# Patient Record
Sex: Female | Born: 1985 | Race: Black or African American | Hispanic: No | Marital: Single | Smoking: Current every day smoker
Health system: Southern US, Community
[De-identification: ages and names within clinical notes are randomized; demographics above are authoritative.]

## PROBLEM LIST (undated history)

## (undated) ENCOUNTER — Ambulatory Visit: Admission: EM | Discharge status: Against Medical Advice | Payer: 59

---

## 2005-02-17 ENCOUNTER — Observation Stay: Payer: Self-pay | Admitting: Obstetrics and Gynecology

## 2005-02-28 ENCOUNTER — Observation Stay: Payer: Self-pay | Admitting: Obstetrics and Gynecology

## 2005-03-02 ENCOUNTER — Observation Stay: Payer: Self-pay | Admitting: Obstetrics and Gynecology

## 2005-03-05 ENCOUNTER — Inpatient Hospital Stay: Payer: Self-pay | Admitting: Obstetrics and Gynecology

## 2005-12-29 ENCOUNTER — Emergency Department: Payer: Self-pay | Admitting: Emergency Medicine

## 2006-01-04 ENCOUNTER — Emergency Department: Payer: Self-pay | Admitting: Internal Medicine

## 2006-06-24 ENCOUNTER — Emergency Department: Payer: Self-pay | Admitting: Emergency Medicine

## 2006-06-24 ENCOUNTER — Observation Stay: Payer: Self-pay

## 2006-07-17 ENCOUNTER — Observation Stay: Payer: Self-pay | Admitting: Certified Nurse Midwife

## 2006-07-26 ENCOUNTER — Observation Stay: Payer: Self-pay

## 2006-08-05 ENCOUNTER — Observation Stay: Payer: Self-pay | Admitting: Obstetrics and Gynecology

## 2006-08-16 ENCOUNTER — Observation Stay: Payer: Self-pay

## 2006-08-22 ENCOUNTER — Inpatient Hospital Stay: Payer: Self-pay | Admitting: Obstetrics and Gynecology

## 2007-04-19 ENCOUNTER — Emergency Department: Payer: Self-pay | Admitting: Unknown Physician Specialty

## 2008-06-10 ENCOUNTER — Emergency Department: Payer: Self-pay | Admitting: Emergency Medicine

## 2008-06-22 ENCOUNTER — Emergency Department: Payer: Self-pay | Admitting: Unknown Physician Specialty

## 2008-07-24 ENCOUNTER — Emergency Department: Payer: Self-pay

## 2009-07-15 ENCOUNTER — Emergency Department: Payer: Self-pay | Admitting: Emergency Medicine

## 2009-07-24 ENCOUNTER — Emergency Department: Payer: Self-pay | Admitting: Emergency Medicine

## 2009-08-30 ENCOUNTER — Emergency Department: Payer: Self-pay | Admitting: Emergency Medicine

## 2009-10-08 ENCOUNTER — Emergency Department: Payer: Self-pay | Admitting: Emergency Medicine

## 2009-12-13 ENCOUNTER — Emergency Department: Payer: Self-pay | Admitting: Emergency Medicine

## 2010-02-03 ENCOUNTER — Emergency Department: Payer: Self-pay | Admitting: Emergency Medicine

## 2010-03-31 ENCOUNTER — Emergency Department: Payer: Self-pay | Admitting: Emergency Medicine

## 2010-05-06 ENCOUNTER — Emergency Department: Payer: Self-pay | Admitting: Emergency Medicine

## 2010-07-26 ENCOUNTER — Emergency Department: Payer: Self-pay | Admitting: Emergency Medicine

## 2010-11-26 ENCOUNTER — Emergency Department: Payer: Self-pay | Admitting: Unknown Physician Specialty

## 2010-11-30 ENCOUNTER — Emergency Department: Payer: Self-pay | Admitting: Emergency Medicine

## 2011-09-02 ENCOUNTER — Emergency Department: Payer: Self-pay | Admitting: Emergency Medicine

## 2012-01-12 ENCOUNTER — Emergency Department: Payer: Self-pay | Admitting: *Deleted

## 2012-01-12 LAB — CBC
HCT: 39.5 % (ref 35.0–47.0)
HGB: 13.1 g/dL (ref 12.0–16.0)
MCH: 29.9 pg (ref 26.0–34.0)
MCHC: 33.2 g/dL (ref 32.0–36.0)
MCV: 90 fL (ref 80–100)
Platelet: 268 10*3/uL (ref 150–440)
RBC: 4.38 10*6/uL (ref 3.80–5.20)
RDW: 12.3 % (ref 11.5–14.5)
WBC: 12.3 10*3/uL — ABNORMAL HIGH (ref 3.6–11.0)

## 2012-01-12 LAB — BASIC METABOLIC PANEL
Anion Gap: 11 (ref 7–16)
BUN: 10 mg/dL (ref 7–18)
Calcium, Total: 8.4 mg/dL — ABNORMAL LOW (ref 8.5–10.1)
Chloride: 106 mmol/L (ref 98–107)
Co2: 23 mmol/L (ref 21–32)
Creatinine: 0.88 mg/dL (ref 0.60–1.30)
EGFR (African American): >60
EGFR (Non-African Amer.): >60
Glucose: 107 mg/dL — ABNORMAL HIGH (ref 65–99)
Osmolality: 279 (ref 275–301)
Potassium: 3.9 mmol/L (ref 3.5–5.1)
Sodium: 140 mmol/L (ref 136–145)

## 2012-01-12 LAB — URINALYSIS, COMPLETE
Bilirubin,UR: NEGATIVE
Leukocyte Esterase: NEGATIVE
Nitrite: NEGATIVE
Ph: 7 (ref 4.5–8.0)
Protein: NEGATIVE
RBC,UR: <1 /HPF (ref 0–5)
Specific Gravity: 1.025 (ref 1.003–1.030)
WBC UR: <1 /HPF (ref 0–5)

## 2012-01-12 LAB — WET PREP, GENITAL

## 2012-01-12 LAB — PREGNANCY, URINE: Pregnancy Test, Urine: NEGATIVE m[IU]/mL

## 2012-05-04 ENCOUNTER — Emergency Department: Payer: Self-pay | Admitting: *Deleted

## 2012-05-04 LAB — URINALYSIS, COMPLETE
Bilirubin,UR: NEGATIVE
Glucose,UR: NEGATIVE mg/dL (ref 0–75)
Ketone: NEGATIVE
Nitrite: NEGATIVE
Specific Gravity: 1.023 (ref 1.003–1.030)
Squamous Epithelial: 4
WBC UR: 1 /HPF (ref 0–5)

## 2012-05-04 LAB — CBC
HCT: 40.2 % (ref 35.0–47.0)
MCH: 29.1 pg (ref 26.0–34.0)
MCV: 88 fL (ref 80–100)
Platelet: 279 10*3/uL (ref 150–440)
RBC: 4.56 10*6/uL (ref 3.80–5.20)
WBC: 10.3 10*3/uL (ref 3.6–11.0)

## 2012-05-04 LAB — HCG, QUANTITATIVE, PREGNANCY: Beta Hcg, Quant.: 530 m[IU]/mL — ABNORMAL HIGH

## 2012-05-09 ENCOUNTER — Emergency Department: Payer: Self-pay | Admitting: *Deleted

## 2012-05-09 LAB — URINALYSIS, COMPLETE
Bilirubin,UR: NEGATIVE
Ph: 5 (ref 4.5–8.0)
RBC,UR: 1912 /HPF (ref 0–5)
Specific Gravity: 1.035 (ref 1.003–1.030)
Squamous Epithelial: 8

## 2012-05-09 LAB — COMPREHENSIVE METABOLIC PANEL
Albumin: 3.9 g/dL (ref 3.4–5.0)
Anion Gap: 7 (ref 7–16)
BUN: 11 mg/dL (ref 7–18)
Calcium, Total: 9 mg/dL (ref 8.5–10.1)
Chloride: 108 mmol/L — ABNORMAL HIGH (ref 98–107)
Creatinine: 1.02 mg/dL (ref 0.60–1.30)
EGFR (African American): >60
Glucose: 91 mg/dL (ref 65–99)
Potassium: 3.7 mmol/L (ref 3.5–5.1)
SGOT(AST): 21 U/L (ref 15–37)
Sodium: 143 mmol/L (ref 136–145)

## 2012-05-09 LAB — CBC
HCT: 39.8 % (ref 35.0–47.0)
MCHC: 33.9 g/dL (ref 32.0–36.0)
Platelet: 289 10*3/uL (ref 150–440)
RDW: 13.2 % (ref 11.5–14.5)
WBC: 12.7 10*3/uL — ABNORMAL HIGH (ref 3.6–11.0)

## 2012-12-21 ENCOUNTER — Emergency Department: Payer: Self-pay | Admitting: Emergency Medicine

## 2012-12-21 LAB — URINALYSIS, COMPLETE
Bilirubin,UR: NEGATIVE
Protein: NEGATIVE
RBC,UR: 1 /HPF (ref 0–5)
Specific Gravity: 1.031 (ref 1.003–1.030)
Squamous Epithelial: 7

## 2012-12-21 LAB — CBC
HGB: 13.3 g/dL (ref 12.0–16.0)
MCH: 29.4 pg (ref 26.0–34.0)
MCHC: 33.3 g/dL (ref 32.0–36.0)
WBC: 12.5 10*3/uL — ABNORMAL HIGH (ref 3.6–11.0)

## 2013-09-20 ENCOUNTER — Emergency Department: Payer: Self-pay | Admitting: Emergency Medicine

## 2013-12-06 ENCOUNTER — Observation Stay: Payer: Self-pay

## 2014-01-01 ENCOUNTER — Observation Stay: Payer: Self-pay | Admitting: Obstetrics and Gynecology

## 2014-01-02 LAB — URINALYSIS, COMPLETE
Bacteria: NONE SEEN
Bilirubin,UR: NEGATIVE
Blood: NEGATIVE
Glucose,UR: NEGATIVE mg/dL (ref 0–75)
Ketone: NEGATIVE
Leukocyte Esterase: NEGATIVE
NITRITE: NEGATIVE
PH: 6 (ref 4.5–8.0)
Protein: NEGATIVE
Specific Gravity: 1.019 (ref 1.003–1.030)
Squamous Epithelial: 4
WBC UR: 1 /HPF (ref 0–5)

## 2014-04-02 ENCOUNTER — Observation Stay: Payer: Self-pay | Admitting: Obstetrics and Gynecology

## 2014-04-05 ENCOUNTER — Observation Stay: Payer: Self-pay | Admitting: Obstetrics and Gynecology

## 2014-04-09 ENCOUNTER — Observation Stay: Payer: Self-pay | Admitting: Obstetrics and Gynecology

## 2014-04-10 ENCOUNTER — Inpatient Hospital Stay: Payer: Self-pay | Admitting: Obstetrics and Gynecology

## 2014-04-10 LAB — CBC WITH DIFFERENTIAL/PLATELET
BASOS PCT: 0.3 %
Basophil #: 0 10*3/uL (ref 0.0–0.1)
Eosinophil #: 0.1 10*3/uL (ref 0.0–0.7)
Eosinophil %: 0.4 %
HCT: 38.6 % (ref 35.0–47.0)
HGB: 12.5 g/dL (ref 12.0–16.0)
LYMPHS ABS: 3.1 10*3/uL (ref 1.0–3.6)
LYMPHS PCT: 20.6 %
MCH: 28.9 pg (ref 26.0–34.0)
MCHC: 32.4 g/dL (ref 32.0–36.0)
MCV: 89 fL (ref 80–100)
Monocyte #: 1.1 x10 3/mm — ABNORMAL HIGH (ref 0.2–0.9)
Monocyte %: 7.3 %
NEUTROS PCT: 71.4 %
Neutrophil #: 10.8 10*3/uL — ABNORMAL HIGH (ref 1.4–6.5)
Platelet: 220 10*3/uL (ref 150–440)
RBC: 4.33 10*6/uL (ref 3.80–5.20)
RDW: 13.6 % (ref 11.5–14.5)
WBC: 15.1 10*3/uL — ABNORMAL HIGH (ref 3.6–11.0)

## 2014-04-11 LAB — DRUG SCREEN, URINE

## 2014-04-12 LAB — HEMATOCRIT: HCT: 34.3 % — ABNORMAL LOW (ref 35.0–47.0)

## 2014-04-13 LAB — PATHOLOGY REPORT

## 2015-02-03 NOTE — Op Note (Signed)
PATIENT NAME:  Janet Howard, Janet Howard MR#:  [id number] DATE OF BIRTH:  28-Jul-1986  DATE OF PROCEDURE:  04/11/2014  PREOPERATIVE DIAGNOSES:  1. Nonreassuring fetal monitoring.  2. Elective permanent sterilization.  3. Forty-one plus [redacted] weeks gestation.   POSTOPERATIVE DIAGNOSES:  1. Nonreassuring fetal monitoring.  2. Elective permanent sterilization.  3. Forty-one plus [redacted] weeks gestation.  4. Meconium staining. 5. Nuchal cord.   PROCEDURES:  1. Primary low transverse cesarean section.  2. Bilateral tubal ligation, [place].   ANESTHESIA: General endotracheal anesthesia, failed attempted spinal.   SURGEON:  Dr. Feliberto GottronSchermerhorn.   INDICATION: This is a 29 year old gravida 7, para 3 patient at 2241 plus 3 weeks estimated gestational age. The patient was admitted to labor and delivery the night prior to the procedure for an induction of labor. The patient labored throughout the day, started to developing moderate variable decelerations and baseline changed from 120s to 130s to low 100s and remained there despite in utero resuscitation, including oxygen, subcutaneous terbutaline, IV fluids, and position changes, cervix at 7 cm.    PROCEDURE:  The patient's abdomen was prepped and draped in normal sterile fashion. Then, performed general endotracheal anesthesia, given that they were unable to attain a spinal block. Pfannenstiel incision was made 2 fingerbreadths above the symphysis pubis. Sharp dissection was used to identify the fascia. Fascia was opened in midline and opened in transverse fashion. The superior aspect of the fascia was grasped with Kocher clamps and the recti muscles were dissected free. The inferior aspect of the fascia was grasped with Kocher clamps. Pyramidalis muscle was dissected free. Entry into the peritoneal cavity was accomplished sharply. The (Dictation Anomaly) <<utero>> peritoneal fold was identified and opened and the bladder flap was created and the bladder was reflected  inferiorly. A low transverse uterine incision was made. Upon entry into the endometrial cavity, moderate to thick meconium was noted. The incision was extended with blunt transverse traction.  Fetal head was then delivered, followed by reduction of a loose nuchal cord. Shoulders and body delivered. Female infant had a spontaneous cry on the abdomen while we were clamping the cord. Therefore, the oropharynx and nasopharynx were then suctioned prior to handing the baby to pediatric staff who assigned Apgar scores of 8 and 9. Placenta was manually delivered and the uterus was exteriorized. The endometrial cavity was wiped clean with a laparotomy tape. Uterine incision was then closed with 1 chromic suture in a running locking fashion. Two additional figure-of-eight sutures were required for good hemostasis. The posterior cul-de-sac was irrigated and suctioned and attention was then directed to the patient's right fallopian tube which was grasped at the midportion of the fallopian tube. Two separate 0 plain gut sutures were applied to the fallopian tube and a 1.5 cm portion of the fallopian tube removed. Good hemostasis noted. A similar procedure was repeated on the patient's left fallopian tube after placing two separate 0 plain gut sutures, 1.5 cm portion of fallopian tube was removed. Good hemostasis was noted. Ovaries appeared normal. The uterus was placed back into the abdominal cavity. The paracolic gutters were then wiped clean with a laparotomy tape and the tubal ligation sites appeared hemostatic. The uterine incision appeared hemostatic. Interceed was placed over the uterine incision in a T-shaped fashion. The superior aspect of the fascia was grasped with Kocher clamps and the On-Q pump catheters were placed from an infraumbilical position to a subfascial position. Then, the fascia was closed over top of these with 0 Vicryl suture in  a running, nonlocking fashion. Good approximation of edges. Subcutaneous  tissues were irrigated and bovied for hemostasis and the skin was reapproximated with Insorb absorbable sutures. Dressing was applied. The On-Q pump catheters were secured at the skin level with Dermabond and steri-stripped to the skin and Tegaderm placed over the catheters. Each catheter was loaded with 5 mL of 0.5% Marcaine. There were no complications. Estimated blood loss 600 mL, intraoperative fluids 1200 mL. Patient did receive 2 grams IV Ancef prior to commencement of the case for prophylaxis. The patient was taken to the recovery room in good condition.    ____________________________ Janet Bouchard, MD tjs:dd D: 04/11/2014 18:23:28 ET T: 04/12/2014 02:46:56 ET JOB#: [id number]  cc: Janet Bouchard, MD, <Dictator> Janet Bouchard MD ELECTRONICALLY SIGNED 04/16/2014 9:30

## 2015-02-20 NOTE — H&P (Signed)
L&D Evaluation:  History:  HPI 15XW [id number] with LMP of 06/25/13 & EDD of 6/20./15 and dated by 19 week scan with EDD of 04/03/14 here due to "soaking Maxi pads with clear fluid x 3 days". Pt seen at [facility] for [facility] in 2012, smokes tobacco, Late entry to care at 17 3/7 weeks, BMI of 43.5. No labor signs or symptoms. No VB, decresed fM or UC's.   Presents with leaking fluid   Patient's Medical History Depression, Anemia, UTI,   Patient's Surgical History D&C  LEEP  2012, [facility] D&?C x 2   Medications Pre [place]atal Vitamins   Social History tobacco  smoker 1-2 yrs-1 ppd now 1 cig a day   Family History Non-Contributory   ROS:  ROS All systems were reviewed.  HEENT, CNS, GI, GU, Respiratory, CV, Renal and Musculoskeletal systems were found to be normal.   Exam:  Vital Signs stable   General no apparent distress   Mental Status clear   Chest clear   Heart normal sinus rhythm, no murmur/gallop/rubs   Abdomen gravid, non-tender   Estimated Fetal Weight Average for gestational age   Fetal Position ? position as cannot feel presenting part   Back no CVAT   Reflexes 1+   Pelvic cervix closed and thick   Mebranes Intact, Spec exam, no fluid with cough   FHT normal rate with no decels, Age appropriate FHR   Skin dry   Lymph no lymphadenopathy   Impression:  Impression IUP at 23 3/7 weeks   Plan:  Plan Neg ferning and neg nitrazine.   Comments Speculum exam done with no fluid seen with a cough. There is clearish cx mucus that does not fern that may be what pt is having. Or she may be urinating. Pt states she had soaked 3 maxipads over the last 3 days of fluid. No perineal fluid on thighs prior to exam.   Electronic Signatures: Sharee PimpleJones, Irine Heminger W (CNM)  (Signed 24-Feb-15 15:50)  Authored: L&D Evaluation   Last Updated: 24-Feb-15 15:50 by Sharee PimpleJones, Mallary Kreger W (CNM)

## 2015-08-17 ENCOUNTER — Other Ambulatory Visit: Payer: Self-pay | Admitting: Primary Care

## 2016-01-03 ENCOUNTER — Other Ambulatory Visit: Payer: Self-pay | Admitting: Primary Care

## 2016-01-03 DIAGNOSIS — R102 Pelvic and perineal pain: Secondary | ICD-10-CM

## 2016-01-07 ENCOUNTER — Ambulatory Visit: Admission: RE | Admit: 2016-01-07 | Payer: Medicaid Other | Source: Ambulatory Visit

## 2016-01-08 ENCOUNTER — Ambulatory Visit: Payer: Medicaid Other

## 2016-01-09 ENCOUNTER — Other Ambulatory Visit: Payer: Self-pay | Admitting: Primary Care

## 2016-01-09 DIAGNOSIS — R102 Pelvic and perineal pain: Secondary | ICD-10-CM

## 2016-01-17 ENCOUNTER — Ambulatory Visit
Admission: RE | Admit: 2016-01-17 | Discharge: 2016-01-17 | Disposition: A | Discharge status: Home / Self Care | Payer: Medicaid Other | Source: Ambulatory Visit | Attending: Primary Care | Admitting: Primary Care

## 2016-01-17 DIAGNOSIS — R102 Pelvic and perineal pain: Secondary | ICD-10-CM

## 2016-01-17 DIAGNOSIS — N888 Other specified noninflammatory disorders of cervix uteri: Secondary | ICD-10-CM | POA: Insufficient documentation

## 2016-02-27 ENCOUNTER — Emergency Department
Admission: EM | Admit: 2016-02-27 | Discharge: 2016-02-27 | Disposition: A | Discharge status: Home / Self Care | Payer: Medicaid Other | Attending: Emergency Medicine | Admitting: Emergency Medicine

## 2016-02-27 ENCOUNTER — Other Ambulatory Visit: Payer: Self-pay

## 2016-02-27 DIAGNOSIS — R079 Chest pain, unspecified: Secondary | ICD-10-CM | POA: Insufficient documentation

## 2016-02-27 DIAGNOSIS — Z5321 Procedure and treatment not carried out due to patient leaving prior to being seen by health care provider: Secondary | ICD-10-CM | POA: Diagnosis not present

## 2016-09-25 ENCOUNTER — Emergency Department: Payer: Medicaid Other

## 2016-09-25 ENCOUNTER — Emergency Department
Admission: EM | Admit: 2016-09-25 | Discharge: 2016-09-25 | Disposition: A | Discharge status: Home / Self Care | Payer: Medicaid Other | Attending: Emergency Medicine | Admitting: Emergency Medicine

## 2016-09-25 DIAGNOSIS — J069 Acute upper respiratory infection, unspecified: Secondary | ICD-10-CM | POA: Diagnosis not present

## 2016-09-25 DIAGNOSIS — R05 Cough: Secondary | ICD-10-CM | POA: Diagnosis present

## 2016-09-25 DIAGNOSIS — R202 Paresthesia of skin: Secondary | ICD-10-CM | POA: Diagnosis not present

## 2016-09-25 DIAGNOSIS — F17200 Nicotine dependence, unspecified, uncomplicated: Secondary | ICD-10-CM | POA: Insufficient documentation

## 2016-09-25 LAB — BASIC METABOLIC PANEL
ANION GAP: 7 (ref 5–15)
BUN: 12 mg/dL (ref 6–20)
CALCIUM: 8.7 mg/dL — AB (ref 8.9–10.3)
CO2: 25 mmol/L (ref 22–32)
Chloride: 108 mmol/L (ref 101–111)
Creatinine, Ser: 0.84 mg/dL (ref 0.44–1.00)
GFR calc Af Amer: >60 mL/min (ref >60)
GLUCOSE: 115 mg/dL — AB (ref 65–99)
POTASSIUM: 3.6 mmol/L (ref 3.5–5.1)
SODIUM: 140 mmol/L (ref 135–145)

## 2016-09-25 LAB — CBC
HEMATOCRIT: 40.1 % (ref 35.0–47.0)
HEMOGLOBIN: 13.7 g/dL (ref 12.0–16.0)
MCH: 29.6 pg (ref 26.0–34.0)
MCHC: 34.1 g/dL (ref 32.0–36.0)
MCV: 86.7 fL (ref 80.0–100.0)
Platelets: 280 10*3/uL (ref 150–440)
RBC: 4.62 MIL/uL (ref 3.80–5.20)
RDW: 13 % (ref 11.5–14.5)
WBC: 12.5 10*3/uL — AB (ref 3.6–11.0)

## 2016-09-25 LAB — TROPONIN I

## 2016-09-25 MED ORDER — BENZONATATE 100 MG PO CAPS: 100.0000 mg | ORAL_CAPSULE | Freq: Four times a day (QID) | ORAL | 0 refills | Status: AC | PRN

## 2016-09-25 NOTE — ED Triage Notes (Signed)
Chest pain, cough, congestion, nasal congestion. Pain radiating towards left arm.

## 2016-09-25 NOTE — ED Provider Notes (Signed)
[facility] Emergency Department Provider Note  ____________________________________________  Time seen: Approximately 9:58 PM  I have reviewed the triage vital signs and the nursing notes.   HISTORY  Chief Complaint URI and Chest Pain    HPI Janet Howard is a 30 y.o. female , otherwise healthy, presenting with 2 days of cough productive of phlegm, congestion and rhinorrhea, chest discomfort with coughing, and bilateral hand tingling. The patient reports that her symptoms started yesterday but this evening during a coughing spasm she noticed tingling in her fingertips on both hands. No headache, visual changes, mental status changes. No difficulty walking, numbness or weakness.Patient had her flu shot this year.   History reviewed. No pertinent past medical history.  There are no active problems to display for this patient.   History reviewed. No pertinent surgical history.    Allergies Patient has no known allergies.  No family history on file.  Social History Social History  Substance Use Topics  . Smoking status: Current Every Day Smoker  . Smokeless tobacco: Never Used  . Alcohol use No    Review of Systems Constitutional: No fever/chills.No lightheadedness or syncope. Eyes: No visual changes. No blurred or double vision. No eye discharge.  ENT: No sore throat. Positive congestion and rhinorrhea. No ear pain. Cardiovascular: Positive chest pain. Denies palpitations. Respiratory: Denies shortness of breath.  Positive cough. Gastrointestinal: No abdominal pain.  No nausea, no vomiting.  No diarrhea.  No constipation. Genitourinary: Negative for dysuria. Musculoskeletal: Negative for back pain. Skin: Negative for rash. Neurological: Negative for headaches. No focal numbness, tingling or weakness.   10-point ROS otherwise negative.  ____________________________________________   PHYSICAL EXAM:  VITAL SIGNS: ED Triage Vitals  [09/25/16 2123]  Enc Vitals Group     BP 107/61     Pulse Rate 97     Resp 18     Temp 98.3 F (36.8 C)     Temp Source Oral     SpO2 100 %     Weight 250 lb (113.4 kg)     Height 5\' 4"  (1.626 m)     Head Circumference      Peak Flow      Pain Score 9     Pain Loc      Pain Edu?      Excl. in GC?     Constitutional: Alert and oriented. Well appearing and in no acute distress. Answers questions appropriately. Eyes: Conjunctivae are normal.  EOMI. No scleral icterus.No eye discharge. EARS: TMs are clear without bulge, erythema or fluid bilaterally. The canals are clear as well. Head: Atraumatic. Nose: Positive congestion/rhinnorhea. Mouth/Throat: Mucous membranes are moist. No posterior pharyngeal erythema, tonsillar swelling or exudate. Posterior palate is symmetric and uvula is midline Neck: No stridor.  Supple.  No meningismus. Cardiovascular: Normal rate, regular rhythm. No murmurs, rubs or gallops.  Respiratory: Normal respiratory effort.  No accessory muscle use or retractions. Lungs CTAB.  Minimal Rales in the right upper lung field without wheezing or rhonchi. Gastrointestinal: Overweight. Soft, nontender and nondistended.  No guarding or rebound.  No peritoneal signs. Musculoskeletal: No LE edema. No ttp in the calves or palpable cords.  Negative Homan's sign. Neurologic:  A&Ox3.  Speech is clear.  Face and smile are symmetric.  EOMI. PERRLA. Moves all extremities well. Gait is normal without ataxia. Skin:  Skin is warm, dry and intact. No rash noted. Psychiatric: Mood and affect are normal. Speech and behavior are normal.  Normal judgement.  ____________________________________________   LABS (all labs ordered are listed, but only abnormal results are displayed)  Labs Reviewed  CBC - Abnormal; Notable for the following:       Result Value   WBC 12.5 (*)    All other components within normal limits  BASIC METABOLIC PANEL  TROPONIN I    ____________________________________________  EKG  ED ECG REPORT I, Rockne MenghiniNorman, Anne-Caroline, the attending physician, personally viewed and interpreted this ECG.   Date: 09/25/2016  EKG Time: 2137  Rate: 89  Rhythm: normal sinus rhythm  Axis: Normal  Intervals:none  ST&T Change: No STEMI  ____________________________________________  RADIOLOGY  No results found.  ____________________________________________   PROCEDURES  Procedure(s) performed: None  Procedures  Critical Care performed: No ____________________________________________   INITIAL IMPRESSION / ASSESSMENT AND PLAN / ED COURSE  Pertinent labs & imaging results that were available during my care of the patient were reviewed by me and considered in my medical decision making (see chart for details).  30 y.o. female with cough and cold symptoms, as well as bilateral hand tingling. It is likely that the patient's tingling was due to hyperventilation in the setting of coughing spasm. I do not see any acute neurologic deficits on my examination. I did hear some rales in the left upper lung base, and we'll get a chest x-ray to evaluate for pneumonia. It is possible the patient has an influenza-like illness, but there would be no additional treatment if she tested positive so this test is deferred. At this time, the patient is hemodynamically stable and if her workup in the emergency department is reassuring, I'll plan to discharge her home with close PMD follow-up. Return precautions were discussed.  ____________________________________________  FINAL CLINICAL IMPRESSION(S) / ED DIAGNOSES  Final diagnoses:  Tingling in extremities  Upper respiratory tract infection, unspecified type    Clinical Course       NEW MEDICATIONS STARTED DURING THIS VISIT:  New Prescriptions   BENZONATATE (TESSALON PERLES) 100 MG CAPSULE    Take 1 capsule (100 mg total) by mouth every 6 (six) hours as needed for cough.       Rockne MenghiniAnne-Caroline Lucresha Dismuke, MD 09/25/16 2204

## 2016-09-25 NOTE — Discharge Instructions (Signed)
Please drink plenty of fluid to stay well-hydrated. You may take Tylenol or Motrin for pain or fever. Please take Tessalon Perles as needed for cough.  Please practice frequent and good handwashing to prevent the spread of infection.  Return to the emergency department if you develop severe pain, shortness of breath, lightheadedness or fainting, inability to keep down fluids, or any other symptoms concerning to you.

## 2016-12-29 ENCOUNTER — Encounter: Payer: Self-pay | Admitting: Intensive Care

## 2016-12-29 DIAGNOSIS — Y999 Unspecified external cause status: Secondary | ICD-10-CM | POA: Diagnosis not present

## 2016-12-29 DIAGNOSIS — Y92410 Unspecified street and highway as the place of occurrence of the external cause: Secondary | ICD-10-CM | POA: Diagnosis not present

## 2016-12-29 DIAGNOSIS — Z041 Encounter for examination and observation following transport accident: Secondary | ICD-10-CM | POA: Insufficient documentation

## 2016-12-29 DIAGNOSIS — Y939 Activity, unspecified: Secondary | ICD-10-CM | POA: Insufficient documentation

## 2016-12-29 DIAGNOSIS — F17210 Nicotine dependence, cigarettes, uncomplicated: Secondary | ICD-10-CM | POA: Diagnosis not present

## 2016-12-29 DIAGNOSIS — Z5321 Procedure and treatment not carried out due to patient leaving prior to being seen by health care provider: Secondary | ICD-10-CM | POA: Insufficient documentation
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
## 2016-12-30 ENCOUNTER — Emergency Department
Admission: EM | Admit: 2016-12-30 | Discharge: 2016-12-30 | Disposition: A | Discharge status: Home / Self Care | Payer: Medicaid Other | Attending: Emergency Medicine | Admitting: Emergency Medicine

## 2017-05-04 ENCOUNTER — Other Ambulatory Visit: Payer: Self-pay | Admitting: Primary Care

## 2017-05-04 DIAGNOSIS — N938 Other specified abnormal uterine and vaginal bleeding: Secondary | ICD-10-CM

## 2017-05-07 ENCOUNTER — Ambulatory Visit: Payer: Medicaid Other

## 2017-11-13 DIAGNOSIS — H538 Other visual disturbances: Secondary | ICD-10-CM | POA: Diagnosis not present

## 2018-01-21 DIAGNOSIS — N760 Acute vaginitis: Secondary | ICD-10-CM | POA: Diagnosis not present

## 2018-02-15 ENCOUNTER — Encounter (INDEPENDENT_AMBULATORY_CARE_PROVIDER_SITE_OTHER): Payer: Medicaid Other | Admitting: Vascular Surgery

## 2018-02-16 ENCOUNTER — Encounter (INDEPENDENT_AMBULATORY_CARE_PROVIDER_SITE_OTHER): Payer: Medicaid Other | Admitting: Vascular Surgery

## 2018-07-09 DIAGNOSIS — Z1389 Encounter for screening for other disorder: Secondary | ICD-10-CM | POA: Diagnosis not present

## 2018-07-09 DIAGNOSIS — Z01419 Encounter for gynecological examination (general) (routine) without abnormal findings: Secondary | ICD-10-CM | POA: Diagnosis not present

## 2018-07-09 DIAGNOSIS — N760 Acute vaginitis: Secondary | ICD-10-CM | POA: Diagnosis not present

## 2018-07-09 DIAGNOSIS — Z23 Encounter for immunization: Secondary | ICD-10-CM | POA: Diagnosis not present

## 2018-10-06 ENCOUNTER — Emergency Department: Payer: 59

## 2018-10-06 ENCOUNTER — Other Ambulatory Visit: Payer: Self-pay

## 2018-10-06 ENCOUNTER — Emergency Department
Admission: EM | Admit: 2018-10-06 | Discharge: 2018-10-06 | Disposition: A | Discharge status: Home / Self Care | Payer: 59 | Attending: Emergency Medicine | Admitting: Emergency Medicine

## 2018-10-06 ENCOUNTER — Encounter: Payer: Self-pay | Admitting: Emergency Medicine

## 2018-10-06 DIAGNOSIS — R05 Cough: Secondary | ICD-10-CM | POA: Diagnosis not present

## 2018-10-06 DIAGNOSIS — R079 Chest pain, unspecified: Secondary | ICD-10-CM | POA: Diagnosis not present

## 2018-10-06 DIAGNOSIS — Z5321 Procedure and treatment not carried out due to patient leaving prior to being seen by health care provider: Secondary | ICD-10-CM | POA: Insufficient documentation

## 2018-10-06 LAB — COMPREHENSIVE METABOLIC PANEL
ALBUMIN: 4.2 g/dL (ref 3.5–5.0)
ALK PHOS: 97 U/L (ref 38–126)
ALT: 15 U/L (ref 0–44)
AST: 16 U/L (ref 15–41)
Anion gap: 8 (ref 5–15)
BILIRUBIN TOTAL: 0.2 mg/dL — AB (ref 0.3–1.2)
BUN: 13 mg/dL (ref 6–20)
CALCIUM: 8.9 mg/dL (ref 8.9–10.3)
CO2: 24 mmol/L (ref 22–32)
CREATININE: 0.7 mg/dL (ref 0.44–1.00)
Chloride: 106 mmol/L (ref 98–111)
GFR calc Af Amer: >60 mL/min (ref >60)
GFR calc non Af Amer: >60 mL/min (ref >60)
GLUCOSE: 88 mg/dL (ref 70–99)
Potassium: 3.8 mmol/L (ref 3.5–5.1)
Sodium: 138 mmol/L (ref 135–145)
TOTAL PROTEIN: 6.7 g/dL (ref 6.5–8.1)

## 2018-10-06 LAB — CBC
HCT: 39.7 % (ref 36.0–46.0)
HEMOGLOBIN: 13 g/dL (ref 12.0–15.0)
MCH: 28.4 pg (ref 26.0–34.0)
MCHC: 32.7 g/dL (ref 30.0–36.0)
MCV: 86.9 fL (ref 80.0–100.0)
NRBC: 0 % (ref 0.0–0.2)
Platelets: 308 10*3/uL (ref 150–400)
RBC: 4.57 MIL/uL (ref 3.87–5.11)
RDW: 12.8 % (ref 11.5–15.5)
WBC: 12.4 10*3/uL — ABNORMAL HIGH (ref 4.0–10.5)

## 2018-10-06 LAB — TROPONIN I: Troponin I: <0.03 ng/mL (ref <0.03)

## 2018-10-06 NOTE — ED Triage Notes (Signed)
Chest pain since this am. Cough since yesterday. Pain increases with inspiration.

## 2018-10-29 DIAGNOSIS — Z1389 Encounter for screening for other disorder: Secondary | ICD-10-CM | POA: Diagnosis not present

## 2018-10-29 DIAGNOSIS — Z789 Other specified health status: Secondary | ICD-10-CM | POA: Diagnosis not present

## 2018-10-29 DIAGNOSIS — B373 Candidiasis of vulva and vagina: Secondary | ICD-10-CM | POA: Diagnosis not present

## 2018-10-29 DIAGNOSIS — Z1329 Encounter for screening for other suspected endocrine disorder: Secondary | ICD-10-CM | POA: Diagnosis not present

## 2018-10-29 DIAGNOSIS — R358 Other polyuria: Secondary | ICD-10-CM | POA: Diagnosis not present

## 2018-10-29 DIAGNOSIS — Z3200 Encounter for pregnancy test, result unknown: Secondary | ICD-10-CM | POA: Diagnosis not present

## 2019-10-17 ENCOUNTER — Other Ambulatory Visit: Payer: Self-pay

## 2019-10-25 ENCOUNTER — Other Ambulatory Visit: Payer: Medicaid Other

## 2020-11-21 ENCOUNTER — Emergency Department (HOSPITAL_COMMUNITY)
Admission: EM | Admit: 2020-11-21 | Discharge: 2020-11-21 | Disposition: A | Discharge status: Home / Self Care | Payer: Medicaid Other | Attending: Emergency Medicine | Admitting: Emergency Medicine

## 2020-11-21 ENCOUNTER — Emergency Department (HOSPITAL_COMMUNITY): Payer: Medicaid Other

## 2020-11-21 ENCOUNTER — Encounter (HOSPITAL_COMMUNITY): Payer: Self-pay

## 2020-11-21 DIAGNOSIS — R079 Chest pain, unspecified: Secondary | ICD-10-CM | POA: Diagnosis not present

## 2020-11-21 DIAGNOSIS — Z5321 Procedure and treatment not carried out due to patient leaving prior to being seen by health care provider: Secondary | ICD-10-CM | POA: Diagnosis not present

## 2020-11-21 DIAGNOSIS — R109 Unspecified abdominal pain: Secondary | ICD-10-CM | POA: Diagnosis not present

## 2020-11-21 DIAGNOSIS — M542 Cervicalgia: Secondary | ICD-10-CM | POA: Diagnosis present

## 2020-11-21 DIAGNOSIS — Y92410 Unspecified street and highway as the place of occurrence of the external cause: Secondary | ICD-10-CM | POA: Diagnosis not present

## 2020-11-21 NOTE — ED Notes (Signed)
Left on own accord 
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
## 2020-11-22 ENCOUNTER — Emergency Department (HOSPITAL_COMMUNITY)
Admission: EM | Admit: 2020-11-22 | Discharge: 2020-11-22 | Disposition: A | Discharge status: Home / Self Care | Payer: No Typology Code available for payment source | Attending: Emergency Medicine | Admitting: Emergency Medicine

## 2020-11-22 ENCOUNTER — Encounter (HOSPITAL_COMMUNITY): Payer: Self-pay

## 2020-11-22 ENCOUNTER — Emergency Department (HOSPITAL_COMMUNITY): Payer: No Typology Code available for payment source

## 2020-11-22 ENCOUNTER — Other Ambulatory Visit: Payer: Self-pay

## 2020-11-22 DIAGNOSIS — Y92410 Unspecified street and highway as the place of occurrence of the external cause: Secondary | ICD-10-CM | POA: Insufficient documentation

## 2020-11-22 DIAGNOSIS — S79911A Unspecified injury of right hip, initial encounter: Secondary | ICD-10-CM | POA: Diagnosis present

## 2020-11-22 DIAGNOSIS — F17210 Nicotine dependence, cigarettes, uncomplicated: Secondary | ICD-10-CM | POA: Diagnosis not present

## 2020-11-22 DIAGNOSIS — M62830 Muscle spasm of back: Secondary | ICD-10-CM | POA: Diagnosis not present

## 2020-11-22 DIAGNOSIS — M25561 Pain in right knee: Secondary | ICD-10-CM | POA: Insufficient documentation

## 2020-11-22 DIAGNOSIS — M7918 Myalgia, other site: Secondary | ICD-10-CM

## 2020-11-22 DIAGNOSIS — S7012XA Contusion of left thigh, initial encounter: Secondary | ICD-10-CM | POA: Diagnosis not present

## 2020-11-22 DIAGNOSIS — M542 Cervicalgia: Secondary | ICD-10-CM | POA: Diagnosis not present

## 2020-11-22 LAB — URINALYSIS, ROUTINE W REFLEX MICROSCOPIC
Bilirubin Urine: NEGATIVE
Glucose, UA: NEGATIVE mg/dL
Hgb urine dipstick: NEGATIVE
Ketones, ur: 5 mg/dL — AB
Leukocytes,Ua: NEGATIVE
Nitrite: NEGATIVE
Protein, ur: NEGATIVE mg/dL
Specific Gravity, Urine: 1.033 — ABNORMAL HIGH (ref 1.005–1.030)
pH: 5 (ref 5.0–8.0)

## 2020-11-22 LAB — CBC WITH DIFFERENTIAL/PLATELET
Abs Immature Granulocytes: 0.03 10*3/uL (ref 0.00–0.07)
Basophils Absolute: 0 10*3/uL (ref 0.0–0.1)
Basophils Relative: 0 %
Eosinophils Absolute: 0.2 10*3/uL (ref 0.0–0.5)
Eosinophils Relative: 1 %
HCT: 41.2 % (ref 36.0–46.0)
Hemoglobin: 13.6 g/dL (ref 12.0–15.0)
Immature Granulocytes: 0 %
Lymphocytes Relative: 25 %
Lymphs Abs: 2.8 10*3/uL (ref 0.7–4.0)
MCH: 29.5 pg (ref 26.0–34.0)
MCHC: 33 g/dL (ref 30.0–36.0)
MCV: 89.4 fL (ref 80.0–100.0)
Monocytes Absolute: 0.7 10*3/uL (ref 0.1–1.0)
Monocytes Relative: 7 %
Neutro Abs: 7.4 10*3/uL (ref 1.7–7.7)
Neutrophils Relative %: 67 %
Platelets: 283 10*3/uL (ref 150–400)
RBC: 4.61 MIL/uL (ref 3.87–5.11)
RDW: 12.9 % (ref 11.5–15.5)
WBC: 11.1 10*3/uL — ABNORMAL HIGH (ref 4.0–10.5)
nRBC: 0 % (ref 0.0–0.2)

## 2020-11-22 LAB — COMPREHENSIVE METABOLIC PANEL
ALT: 22 U/L (ref 0–44)
AST: 22 U/L (ref 15–41)
Albumin: 4 g/dL (ref 3.5–5.0)
Alkaline Phosphatase: 92 U/L (ref 38–126)
Anion gap: 12 (ref 5–15)
BUN: 13 mg/dL (ref 6–20)
CO2: 23 mmol/L (ref 22–32)
Calcium: 9.1 mg/dL (ref 8.9–10.3)
Chloride: 103 mmol/L (ref 98–111)
Creatinine, Ser: 0.73 mg/dL (ref 0.44–1.00)
GFR, Estimated: >60 mL/min (ref >60)
Glucose, Bld: 140 mg/dL — ABNORMAL HIGH (ref 70–99)
Potassium: 3.8 mmol/L (ref 3.5–5.1)
Sodium: 138 mmol/L (ref 135–145)
Total Bilirubin: 0.8 mg/dL (ref 0.3–1.2)
Total Protein: 7 g/dL (ref 6.5–8.1)

## 2020-11-22 MED ORDER — ACETAMINOPHEN 325 MG PO TABS
650.0000 mg | ORAL_TABLET | Freq: Once | ORAL | Status: AC
  Administered 2020-11-22: 650 mg via ORAL
  Filled 2020-11-22: qty 2

## 2020-11-22 MED ORDER — MELOXICAM 7.5 MG PO TABS: 7.5000 mg | ORAL_TABLET | Freq: Every day | ORAL | 0 refills | Status: DC

## 2020-11-22 MED ORDER — METHOCARBAMOL 500 MG PO TABS: 500.0000 mg | ORAL_TABLET | Freq: Two times a day (BID) | ORAL | 0 refills | Status: DC
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
## 2020-11-22 NOTE — Discharge Instructions (Signed)
Take Robaxin and meloxicam as needed as prescribed for pain and muscle soreness. Plan warm compresses to sore muscles followed by gentle stretching.  Recheck with your doctor, call to schedule appointment.  Return to the emergency room for worsening or concerning symptoms.

## 2021-08-11 DIAGNOSIS — R0789 Other chest pain: Secondary | ICD-10-CM | POA: Insufficient documentation

## 2021-08-11 DIAGNOSIS — M79644 Pain in right finger(s): Secondary | ICD-10-CM | POA: Insufficient documentation

## 2021-08-11 DIAGNOSIS — M79604 Pain in right leg: Secondary | ICD-10-CM | POA: Insufficient documentation

## 2021-08-11 DIAGNOSIS — Y92410 Unspecified street and highway as the place of occurrence of the external cause: Secondary | ICD-10-CM | POA: Diagnosis not present

## 2021-08-11 DIAGNOSIS — Z5321 Procedure and treatment not carried out due to patient leaving prior to being seen by health care provider: Secondary | ICD-10-CM | POA: Diagnosis not present
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
## 2021-08-12 ENCOUNTER — Emergency Department: Payer: Medicaid Other

## 2021-08-12 ENCOUNTER — Other Ambulatory Visit: Payer: Medicaid Other

## 2021-08-12 ENCOUNTER — Emergency Department
Admission: EM | Admit: 2021-08-12 | Discharge: 2021-08-12 | Disposition: A | Discharge status: Home / Self Care | Payer: Medicaid Other | Attending: Emergency Medicine | Admitting: Emergency Medicine

## 2021-08-12 ENCOUNTER — Encounter: Payer: Self-pay | Admitting: Radiology

## 2021-08-12 MED ORDER — ACETAMINOPHEN 325 MG PO TABS: 650.0000 mg | ORAL_TABLET | Freq: Once | ORAL | Status: DC

## 2021-08-12 NOTE — ED Provider Notes (Signed)
-----------------------------------------   4:57 AM on 08/12/2021 -----------------------------------------  I signed up for this patient and her 2 family members and went to see them in the waiting room which was necessary due to overwhelming ED and hospital patient volumes and limited staffing.  Unfortunately the patient and her family had left prior to completing care or being evaluated by a provider.  I reviewed her imaging and see that there is no evidence of an emergent medical condition or traumatic injury on her imaging.   Loleta Rose, MD 08/12/21 [phone]

## 2021-12-13 ENCOUNTER — Encounter (HOSPITAL_COMMUNITY): Payer: Self-pay

## 2021-12-13 ENCOUNTER — Ambulatory Visit (HOSPITAL_COMMUNITY)
Admission: EM | Admit: 2021-12-13 | Discharge: 2021-12-13 | Disposition: A | Discharge status: Home / Self Care | Payer: BC Managed Care – PPO | Attending: Nurse Practitioner | Admitting: Nurse Practitioner

## 2021-12-13 ENCOUNTER — Other Ambulatory Visit: Payer: Self-pay

## 2021-12-13 DIAGNOSIS — Z01411 Encounter for gynecological examination (general) (routine) with abnormal findings: Secondary | ICD-10-CM | POA: Insufficient documentation

## 2021-12-13 DIAGNOSIS — B3731 Acute candidiasis of vulva and vagina: Secondary | ICD-10-CM

## 2021-12-13 DIAGNOSIS — Z760 Encounter for issue of repeat prescription: Secondary | ICD-10-CM | POA: Diagnosis not present

## 2021-12-13 DIAGNOSIS — N760 Acute vaginitis: Secondary | ICD-10-CM | POA: Diagnosis present

## 2021-12-13 DIAGNOSIS — B9689 Other specified bacterial agents as the cause of diseases classified elsewhere: Secondary | ICD-10-CM

## 2021-12-13 DIAGNOSIS — Z113 Encounter for screening for infections with a predominantly sexual mode of transmission: Secondary | ICD-10-CM | POA: Insufficient documentation

## 2021-12-13 LAB — POC URINE PREG, ED: Preg Test, Ur: NEGATIVE

## 2021-12-13 LAB — POCT URINALYSIS DIPSTICK, ED / UC
Glucose, UA: NEGATIVE mg/dL
Hgb urine dipstick: NEGATIVE
Leukocytes,Ua: NEGATIVE
Nitrite: NEGATIVE
Protein, ur: 30 mg/dL — AB
Specific Gravity, Urine: >=1.03 (ref 1.005–1.030)
Urobilinogen, UA: 0.2 mg/dL (ref 0.0–1.0)
pH: 5.5 (ref 5.0–8.0)

## 2021-12-13 MED ORDER — METRONIDAZOLE 500 MG PO TABS: 500.0000 mg | ORAL_TABLET | Freq: Two times a day (BID) | ORAL | 0 refills | Status: DC

## 2021-12-13 MED ORDER — CLONAZEPAM 2 MG PO TABS: 2.0000 mg | ORAL_TABLET | Freq: Every day | ORAL | 0 refills | Status: DC

## 2021-12-13 MED ORDER — FLUCONAZOLE 150 MG PO TABS: ORAL_TABLET | ORAL | 0 refills | Status: AC

## 2021-12-13 NOTE — ED Provider Notes (Signed)
[facility]    CSN: [id number] Arrival date & time: 12/13/21  1542      History   Chief Complaint Chief Complaint  Patient presents with   Abdominal Pain    HPI Janet Howard is a 36 y.o. female.   The patient is a 36 year old female who presents for vaginal symptoms.  Symptoms started about 2 to 3 days ago.  She complains of vaginal discharge, vaginal itching, and vaginal odor.  She states the discharge is thick and white, describes the odor as fishy.  She denies any urinary frequency, urgency, hesitancy, or hematuria.  She also complains of pelvic pain and tenderness.  She denies any nausea, vomiting, or diarrhea.  She is sexually active with 1 female partner over the past 90 days with 0% condom use.  She denies previous history of STI or STD.  She also denies any recent antibiotic use.  She is also requesting a refill for her clonazepam 2 mg.  States she has been out of this for a couple of days and she cannot get into see her provider for another 2 weeks.  She takes this medication for anxiety and reports her anxiety has been increased over the past couple of days.   Abdominal Pain Associated symptoms: vaginal bleeding and vaginal discharge   Associated symptoms: no dysuria and no hematuria    History reviewed. No pertinent past medical history.  There are no problems to display for this patient.   History reviewed. No pertinent surgical history.  OB History   No obstetric history on file.      Home Medications    Prior to Admission medications   Medication Sig Start Date End Date Taking? Authorizing Provider  clonazePAM (KLONOPIN) 2 MG tablet Take 1 tablet (2 mg total) by mouth daily for 15 days. 12/13/21 12/28/21 Yes Leath-Warren, Sadie Haber, NP  fluconazole (DIFLUCAN) 150 MG tablet Take 1 tablet today. Take the second tablet when you complete the antibiotic. 12/13/21 12/20/21 Yes Leath-Warren, Sadie Haber, NP  metroNIDAZOLE (FLAGYL) 500 MG tablet Take 1 tablet  (500 mg total) by mouth 2 (two) times daily. 12/13/21  Yes Leath-Warren, Sadie Haber, NP  meloxicam (MOBIC) 7.5 MG tablet Take 1 tablet (7.5 mg total) by mouth daily. 11/22/20   Jeannie Fend, PA-C  methocarbamol (ROBAXIN) 500 MG tablet Take 1 tablet (500 mg total) by mouth 2 (two) times daily. 11/22/20   Jeannie Fend, PA-C    Family History Family History  Problem Relation Age of Onset   Diabetes Mother     Social History Social History   Tobacco Use   Smoking status: Every Day    Packs/day: 0.50    Types: Cigarettes   Smokeless tobacco: Never  Vaping Use   Vaping Use: Some days   Substances: Flavoring  Substance Use Topics   Alcohol use: No   Drug use: Never     Allergies   Patient has no known allergies.   Review of Systems Review of Systems  Constitutional: Negative.   Respiratory: Negative.    Cardiovascular: Negative.   Gastrointestinal:  Negative for abdominal pain.  Genitourinary:  Positive for pelvic pain, vaginal bleeding and vaginal discharge. Negative for dysuria, frequency, hematuria, menstrual problem and urgency.  Skin: Negative.   Psychiatric/Behavioral: Negative.      Physical Exam Triage Vital Signs ED Triage Vitals  Enc Vitals Group     BP 12/13/21 1652 104/68     Pulse Rate 12/13/21 1652 88  Resp 12/13/21 1652 20     Temp 12/13/21 1652 97.8 F (36.6 C)     Temp Source 12/13/21 1652 Oral     SpO2 12/13/21 1652 97 %     Weight --      Height --      Head Circumference --      Peak Flow --      Pain Score 12/13/21 1650 10     Pain Loc --      Pain Edu? --      Excl. in [place]? --    No data found.  Updated Vital Signs BP 104/68 (BP Location: Left Arm)   Pulse 88   Temp 97.8 F (36.6 C) (Oral)   Resp 20   LMP 10/24/2021   SpO2 97%   Visual Acuity Right Eye Distance:   Left Eye Distance:   Bilateral Distance:    Right Eye Near:   Left Eye Near:    Bilateral Near:     Physical Exam Constitutional:      Appearance: She  is well-developed. She is obese.  HENT:     Head: Normocephalic.  Eyes:     Extraocular Movements: Extraocular movements intact.     Pupils: Pupils are equal, round, and reactive to light.  Cardiovascular:     Rate and Rhythm: Normal rate and regular rhythm.     Heart sounds: Normal heart sounds.  Pulmonary:     Effort: Pulmonary effort is normal.     Breath sounds: Normal breath sounds.  Abdominal:     General: Bowel sounds are normal. There is no distension.     Palpations: Abdomen is soft.     Tenderness: There is abdominal tenderness (suprapubic tenderness) in the suprapubic area. There is no right CVA tenderness, left CVA tenderness or guarding.  Skin:    General: Skin is warm and dry.  Neurological:     General: No focal deficit present.     Mental Status: She is alert and oriented to person, place, and time.  Psychiatric:        Mood and Affect: Mood normal.        Behavior: Behavior normal.     UC Treatments / Results  Labs (all labs ordered are listed, but only abnormal results are displayed) Labs Reviewed  POCT URINALYSIS DIPSTICK, ED / UC - Abnormal; Notable for the following components:      Result Value   Bilirubin Urine SMALL (*)    Ketones, ur TRACE (*)    Protein, ur 30 (*)    All other components within normal limits  PREGNANCY, URINE  URINALYSIS, DIPSTICK ONLY  POC URINE PREG, ED  POCT URINALYSIS DIPSTICK, ED / UC  CERVICOVAGINAL ANCILLARY ONLY    EKG   Radiology No results found.  Procedures Procedures (including critical care time)  Medications Ordered in UC Medications - No data to display  Initial Impression / Assessment and Plan / UC Course  I have reviewed the triage vital signs and the nursing notes.  Pertinent labs & imaging results that were available during my care of the patient were reviewed by me and considered in my medical decision making (see chart for details).  Clinical Course as of 12/13/21 1723  Caleen Essex Dec 13, 2021   1711 Cervicovaginal ancillary only [CL]    Clinical Course User Index [CL] Leath-Warren, Sadie Haber, NP    Patient presents with vaginal itching, vaginal odor, no discharge.  Urine pregnancy and urinalysis are negative  for UTI and pregnancy.  She does have some ketones and protein in her urine which I have informed her of.  She will need to follow-up with her primary care for repeat urinalysis.  Symptoms that she describes correlate with bacterial vaginosis and yeast.  We will treat patient for both at this time.  No concern for acute abdomen as she does not have fever, chills, nausea, or vomiting.  She is sexually active, so we will send off for cytology.  Patient informed she will be contacted if any of her results are positive.  We will also refill patient's clonazepam until she can see her regular doctor.  Will provide a 15-day refill for the patient, informed that she will need to follow-up with her primary care or prescribing physician if she needs a longer course of medication.  Patient was given instructions on how to take medications for her vaginal symptoms.  Patient encouraged to follow-up if symptoms worsen or do not improve. Final Clinical Impressions(s) / UC Diagnoses   Final diagnoses:  Bacterial vaginosis  Vaginal yeast infection  Medication refill     Discharge Instructions      Take medications as prescribed.  Follow instructions provided on how to take medication as prescribed for BV and yeast. You will be contacted once your cytology results are received.  If they are positive you will be contacted and provided treatment at that time. Follow-up with primary care or prescribing provider if you need additional clonazepam until your appointment. Follow-up if symptoms worsen or do not improve.     ED Prescriptions     Medication Sig Dispense Auth. Provider   metroNIDAZOLE (FLAGYL) 500 MG tablet Take 1 tablet (500 mg total) by mouth 2 (two) times daily. 14 tablet  Leath-Warren, Sadie Haber, NP   fluconazole (DIFLUCAN) 150 MG tablet Take 1 tablet today. Take the second tablet when you complete the antibiotic. 2 tablet Leath-Warren, Sadie Haber, NP   clonazePAM (KLONOPIN) 2 MG tablet Take 1 tablet (2 mg total) by mouth daily for 15 days. 15 tablet Leath-Warren, Sadie Haber, NP      PDMP not reviewed this encounter.   Abran Cantor, NP 12/13/21 1724

## 2021-12-13 NOTE — ED Triage Notes (Signed)
Pt c/o lower abd pressure, vaginal discharge, odor and lower back pain.  ?Started: 2 days ago ?

## 2021-12-13 NOTE — Discharge Instructions (Signed)
Take medications as prescribed.  Follow instructions provided on how to take medication as prescribed for BV and yeast. ?You will be contacted once your cytology results are received.  If they are positive you will be contacted and provided treatment at that time. ?Follow-up with primary care or prescribing provider if you need additional clonazepam until your appointment. ?Follow-up if symptoms worsen or do not improve. ?

## 2021-12-16 LAB — CERVICOVAGINAL ANCILLARY ONLY
Bacterial Vaginitis (gardnerella): POSITIVE — AB
Candida Glabrata: NEGATIVE
Candida Vaginitis: NEGATIVE
Chlamydia: NEGATIVE
Comment: NEGATIVE
Comment: NEGATIVE
Comment: NEGATIVE
Comment: NEGATIVE
Comment: NEGATIVE
Comment: NORMAL
Neisseria Gonorrhea: NEGATIVE
Trichomonas: NEGATIVE

## 2022-02-03 ENCOUNTER — Ambulatory Visit: Payer: Medicaid Other
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
## 2022-11-15 ENCOUNTER — Emergency Department
Admission: EM | Admit: 2022-11-15 | Discharge: 2022-11-15 | Disposition: A | Discharge status: Home / Self Care | Payer: BC Managed Care – PPO | Attending: Emergency Medicine | Admitting: Emergency Medicine

## 2022-11-15 ENCOUNTER — Other Ambulatory Visit: Payer: Self-pay

## 2022-11-15 DIAGNOSIS — Z0000 Encounter for general adult medical examination without abnormal findings: Secondary | ICD-10-CM | POA: Diagnosis present

## 2022-11-15 NOTE — Progress Notes (Signed)
Warrant for blood draw reviewed and executed in my presence.
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
## 2022-11-15 NOTE — ED Provider Notes (Signed)
   [facility] Provider Note    Event Date/Time   First MD Initiated Contact with Patient 11/15/22 [phone]     (approximate)   History   Labs Only   HPI  Janet Howard is a 37 y.o. female presents to the emergency department for lab drawl with [employer].  Reported acute intoxication.  Patient yelling in the room stating that she only drank 1 beer.  States that she does not want to be seen for any medical complaints.  Denies any trauma or pain.  States that she wants to talk to her lawyer.     Physical Exam   Triage Vital Signs: ED Triage Vitals [11/15/22 0336]  Enc Vitals Group     BP      Pulse      Resp      Temp      Temp src      SpO2      Weight      Height      Head Circumference      Peak Flow      Pain Score 0     Pain Loc      Pain Edu?      Excl. in [place]?     Most recent vital signs: There were no vitals filed for this visit.  Physical Exam Constitutional:      Appearance: She is well-developed.  HENT:     Head: Atraumatic.  Eyes:     Conjunctiva/sclera: Conjunctivae normal.  Cardiovascular:     Rate and Rhythm: Regular rhythm.  Pulmonary:     Effort: No respiratory distress.  Abdominal:     General: There is no distension.  Musculoskeletal:        General: Normal range of motion.     Cervical back: Normal range of motion.     Comments: Handcuffs with hands behind her  Neurological:     Mental Status: She is alert. Mental status is at baseline.  Psychiatric:     Comments: Yelling and agitated      IMPRESSION / MDM / ASSESSMENT AND PLAN / ED COURSE  I reviewed the triage vital signs and the nursing notes.  Presents to the emergency department for a lab draw with PD.  Patient states that she does not want to be evaluated for any medical issues.  No external signs of trauma.  Patient here for lab draw, medically cleared to be discharged with PD.  Labs (all labs ordered are listed, but only abnormal results are  displayed) Labs interpreted as -    Labs Reviewed - No data to display       PROCEDURES:  Critical Care performed: No  Procedures  Patient's presentation is most consistent with acute illness / injury with system symptoms.   MEDICATIONS ORDERED IN ED: Medications - No data to display  FINAL CLINICAL IMPRESSION(S) / ED DIAGNOSES   Final diagnoses:  Evaluation by medical service required     Rx / DC Orders   ED Discharge Orders     None        Note:  This document was prepared using Dragon voice recognition software and may include unintentional dictation errors.   Nathaniel Man, MD 11/15/22 [phone]

## 2022-11-15 NOTE — ED Notes (Signed)
Pt bib PD, very uncooperative and verbally threatening. Pt refuses everything, including VS and to be seen by provider. No medical complaints at this time, in NAD. Warrant present for blood draw

## 2022-11-15 NOTE — ED Notes (Signed)
Writer to room 2 for forensic blood draw. Pt uncooperative and refused blood draw. Denyse Amass, [facility], was given search warrant provided by [employer] Officer Nelida Gores.  AC gave consent to draw blood with search warrant.  Forensic kit was provided by [employer]. [place] was applied to upper right arm, just above the elbow. Site cleansed with betadine swab provided in kit. Blood drawn from right forearm. 2 gray top tubes were obtained, provided in kit. [place]et was removed. Needle withdrawn intact. Sterile dressing applied to site and bleeding was controlled. Tubes handed over to [employer] Officer Nelida Gores. Pt ambulated out of ED with [employer].

## 2023-02-19 ENCOUNTER — Ambulatory Visit: Payer: BC Managed Care – PPO

## 2023-03-25 ENCOUNTER — Ambulatory Visit: Payer: BC Managed Care – PPO

## 2023-05-28 NOTE — Progress Notes (Signed)
I sent a video visit link through Epic, but the patient didn't sign in. I tried calling for today's appointment; someone answered with 'hello,' but when I introduced myself, the call was disconnected. I called again, but there was no answer. I left a voicemail asking the patient to contact the office.

## 2023-05-30 ENCOUNTER — Ambulatory Visit (HOSPITAL_BASED_OUTPATIENT_CLINIC_OR_DEPARTMENT_OTHER): Payer: Self-pay | Admitting: Psychiatry

## 2023-05-30 ENCOUNTER — Encounter (HOSPITAL_COMMUNITY): Payer: Self-pay

## 2023-05-30 DIAGNOSIS — Z91199 Patient's noncompliance with other medical treatment and regimen due to unspecified reason: Secondary | ICD-10-CM

## 2024-06-16 ENCOUNTER — Ambulatory Visit

## 2024-06-16 VITALS — BP 114/85 | HR 81 | Ht 64.0 in | Wt 262.6 lb

## 2024-06-16 DIAGNOSIS — Z6841 Body Mass Index (BMI) 40.0 and over, adult: Secondary | ICD-10-CM | POA: Diagnosis not present

## 2024-06-16 DIAGNOSIS — R21 Rash and other nonspecific skin eruption: Secondary | ICD-10-CM

## 2024-06-16 DIAGNOSIS — L987 Excessive and redundant skin and subcutaneous tissue: Secondary | ICD-10-CM | POA: Diagnosis not present

## 2024-06-16 DIAGNOSIS — E65 Localized adiposity: Secondary | ICD-10-CM | POA: Diagnosis not present

## 2024-06-16 NOTE — Progress Notes (Signed)
 Plastic & Reconstructive Surgery New Patient Visit  Patient: Janet Howard MRN: [id number] Date: 06/16/24   Chief Complaint: Excess skin and adipose tissue in the abdomen  History of Present Illness:  This is a 38 y.o. female with PMH and PSH as presented below who presents for consultation for body contouring. Her weight today is 262 lbs and Body mass index is 45.08 kg/m.. With the weight gain she has noted excess skin and fat involving abdomen. Denies rashes. Does not follow with Nutrition. No known nutritional issues. No hernias. Pt is otherwise healthy and is a former smoker. Functional status is excellent.   Past Medical History: No past medical history on file.  Past Surgical History: No past surgical history on file.  Current Medications: Current Outpatient Medications on File Prior to Visit  Medication Sig Dispense Refill   clonazePAM  (KLONOPIN ) 2 MG tablet Take 1 tablet (2 mg total) by mouth daily for 15 days. 15 tablet 0   meloxicam  (MOBIC ) 7.5 MG tablet Take 1 tablet (7.5 mg total) by mouth daily. 10 tablet 0   methocarbamol  (ROBAXIN ) 500 MG tablet Take 1 tablet (500 mg total) by mouth 2 (two) times daily. 20 tablet 0   metroNIDAZOLE  (FLAGYL ) 500 MG tablet Take 1 tablet (500 mg total) by mouth 2 (two) times daily. 14 tablet 0   No current facility-administered medications on file prior to visit.    Allergies: No Known Allergies  Family History:  Family history is negative for bleeding/clotting disorders, problems with anesthesia, or connective tissue disorders.   Social History:  Social History   Socioeconomic History   Marital status: Single    Spouse name: Not on file   Number of children: Not on file   Years of education: Not on file   Highest education level: Not on file  Occupational History   Not on file  Tobacco Use   Smoking status: Every Day    Current packs/day: 0.50    Types: Cigarettes   Smokeless tobacco: Never  Vaping Use   Vaping status:  Some Days   Substances: Flavoring  Substance and Sexual Activity   Alcohol use: No   Drug use: Never   Sexual activity: Not on file  Other Topics Concern   Not on file  Social History Narrative   Not on file   Social Drivers of Health   Financial Resource Strain: Not on file  Food Insecurity: No Food Insecurity (03/16/2024)   Received from [facility]   Hunger Vital Sign    Within the past 12 months, you worried that your food would run out before you got the money to buy more.: Never true    Within the past 12 months, the food you bought just didn't last and you didn't have money to get more.: Never true  Transportation Needs: No Transportation Needs (03/16/2024)   [facility]   PRAPARE - Transportation    Lack of Transportation (Medical): No    Lack of Transportation (Non-Medical): No  Physical Activity: Not on file  Stress: Not on file  Social Connections: Not on file   Smoker Former Recreational Drug use: Denies  Review of systems: 10 point review of systems performed and negative except as noted in the HPI  Physical Exam: BP 114/85 (BP Location: Left Arm, Patient Position: Sitting, Cuff Size: Large)   Pulse 81   Ht 5' 4 (1.626 m)   Wt 262 lb 9.6 oz (119.1 kg)   SpO2 97%  BMI 45.08 kg/m  Body mass index is 45.08 kg/m.  Physical Exam          \ General: Well appearing, no apparent distress. Pulm: Breathing comfortably on room air without sounds/wheezing. Abdomen: Soft, non-tender, no distension. No palpable hernia or bulge. Global lipodystrophy of abdomen with excess skin in both horizontal and vertical dimensions. No current intertriginous rash Neuro: Moving all four extremities spontaneously. Psych: Appropriate mood and affect.  Labs: No results found for this or any previous visit (from the past 2160 hours).  Pertinent Imaging:  None  Assessment: This is a 38 y.o. female who presents for consultation for body contouring. Her  weight today is 262 lbs and Body mass index is 45.08 kg/m.. With the weight gain she has noted excess skin and fat involving abdomen. Denies rashes. Does not follow with Nutrition. No known nutritional issues. No hernias. Pt is otherwise healthy and is a former smoker. Functional status is excellent. She has not tried any weight loss strategies. Patient is interested in abdominoplasty.  We discussed the patient's goals and priorities at length. We reviewed that body contouring operations trade better contours at the expense of long, sometimes wide scars. We further discussed the operation(s) in detail including the incision and scar patterns, need for surgical drains and importance of postoperative compression. We reviewed that massive weight loss patients are at a higher risk for recurrent laxity and wound healing problems including dehiscence and infection. We further discussed the risks of bleeding, seroma, hematoma, asymmetries, standing cutaneous deformities or other contour deformities, asymmetry, skin/nipple/umbilical necrosis, fat necrosis/oil cysts, lymphedema, chronic wounds or sinus tracts, hypertrophic and keloid scarring, nerve injury (ie. LFCN, iliohypogastric, ilioinguinal nerves), numbness, paresthesia and sensory changes, intraabdominal injury. Revision procedures are not uncommon. We also discussed the risk of DVT/PE, fat embolism, heart attack, stroke, death as well as the risks of anesthesia. We reviewed the expected recovery period with no heavy activities or lifting >5lbs for 6 weeks postoperatively.   The patient's BMI today is Body mass index is 45.08 kg/m.SABRA We discussed that patients with BMI>30 are more likely to suffer both minor and major complications following these procedures. The patient verbalized understanding.  All questions and concerns were addressed to the patient's apparent satisfaction.  Plan - We will plan for Healthy Weigh and Wellness with a BMI goal <40  - We  will see each other in 12 weeks to assess progress and plan for future surgery.  The sensitive parts of the examination/procedure were performed with RN as chaperone.  The time documented represents the total time spent on the day of the encounter in preparing for and completing the visit. It does not include time spent by ancillary staff, a resident, a fellow, another trainee, or, for shared visits, time spent jointly with the patient or discussing the case or the performance of other separately performed services.  Time spent: 45 minutes.     Gene Glazebrook, MD [facility]  06/16/2024 8:47 AM

## 2024-06-28 ENCOUNTER — Encounter (INDEPENDENT_AMBULATORY_CARE_PROVIDER_SITE_OTHER): Payer: Self-pay

## 2024-09-15 ENCOUNTER — Ambulatory Visit

## 2024-09-15 ENCOUNTER — Telehealth: Payer: Self-pay

## 2024-09-15 NOTE — Telephone Encounter (Signed)
 Sent pt a mychart message that we tried to call and r/s her apt on 09-12-24, pt never returned call. Apt has been canceled, pt can call and r/s
